# Patient Record
Sex: Female | Born: 1945 | Race: Black or African American | Hispanic: No
Health system: Southern US, Community
[De-identification: ages and names within clinical notes are randomized; demographics above are authoritative.]

---

## 1999-06-09 ENCOUNTER — Ambulatory Visit (HOSPITAL_COMMUNITY): Admission: RE | Admit: 1999-06-09 | Discharge: 1999-06-09 | Payer: Self-pay | Admitting: Internal Medicine

## 1999-06-09 ENCOUNTER — Encounter: Payer: Self-pay | Admitting: Internal Medicine

## 2000-11-09 ENCOUNTER — Other Ambulatory Visit: Admission: RE | Admit: 2000-11-09 | Discharge: 2000-11-09 | Payer: Self-pay | Admitting: *Deleted

## 2009-01-15 ENCOUNTER — Ambulatory Visit (HOSPITAL_COMMUNITY): Admission: RE | Admit: 2009-01-15 | Discharge: 2009-01-15 | Payer: Self-pay | Admitting: Internal Medicine

## 2010-07-25 ENCOUNTER — Encounter: Payer: Self-pay | Admitting: Internal Medicine

## 2012-01-11 ENCOUNTER — Other Ambulatory Visit (HOSPITAL_COMMUNITY): Payer: Self-pay | Admitting: Internal Medicine

## 2012-01-11 DIAGNOSIS — Z1231 Encounter for screening mammogram for malignant neoplasm of breast: Secondary | ICD-10-CM

## 2012-02-01 ENCOUNTER — Ambulatory Visit (HOSPITAL_COMMUNITY)
Admission: RE | Admit: 2012-02-01 | Discharge: 2012-02-01 | Disposition: A | Discharge status: Home / Self Care | Payer: Medicare Other | Source: Ambulatory Visit | Attending: Internal Medicine | Admitting: Internal Medicine

## 2012-02-01 DIAGNOSIS — Z1231 Encounter for screening mammogram for malignant neoplasm of breast: Secondary | ICD-10-CM

## 2013-01-25 ENCOUNTER — Other Ambulatory Visit (HOSPITAL_COMMUNITY): Payer: Self-pay | Admitting: Internal Medicine

## 2013-01-25 DIAGNOSIS — Z1231 Encounter for screening mammogram for malignant neoplasm of breast: Secondary | ICD-10-CM

## 2013-02-15 ENCOUNTER — Ambulatory Visit (HOSPITAL_COMMUNITY)
Admission: RE | Admit: 2013-02-15 | Discharge: 2013-02-15 | Disposition: A | Discharge status: Home / Self Care | Payer: Medicare Other | Source: Ambulatory Visit | Attending: Internal Medicine | Admitting: Internal Medicine

## 2013-02-15 DIAGNOSIS — Z1231 Encounter for screening mammogram for malignant neoplasm of breast: Secondary | ICD-10-CM | POA: Insufficient documentation

## 2014-10-07 ENCOUNTER — Other Ambulatory Visit (HOSPITAL_COMMUNITY): Payer: Self-pay | Admitting: Internal Medicine

## 2014-10-07 DIAGNOSIS — Z1231 Encounter for screening mammogram for malignant neoplasm of breast: Secondary | ICD-10-CM

## 2014-10-09 ENCOUNTER — Ambulatory Visit (HOSPITAL_COMMUNITY)
Admission: RE | Admit: 2014-10-09 | Discharge: 2014-10-09 | Disposition: A | Discharge status: Home / Self Care | Payer: Commercial Managed Care - HMO | Source: Ambulatory Visit | Attending: Internal Medicine | Admitting: Internal Medicine

## 2014-10-09 DIAGNOSIS — Z1231 Encounter for screening mammogram for malignant neoplasm of breast: Secondary | ICD-10-CM | POA: Insufficient documentation

## 2016-12-02 ENCOUNTER — Other Ambulatory Visit: Payer: Self-pay | Admitting: Family Medicine

## 2016-12-02 DIAGNOSIS — M810 Age-related osteoporosis without current pathological fracture: Secondary | ICD-10-CM

## 2017-06-21 ENCOUNTER — Other Ambulatory Visit: Payer: Self-pay | Admitting: Family Medicine

## 2017-06-21 DIAGNOSIS — Z1231 Encounter for screening mammogram for malignant neoplasm of breast: Secondary | ICD-10-CM

## 2017-09-15 ENCOUNTER — Ambulatory Visit
Admission: RE | Admit: 2017-09-15 | Discharge: 2017-09-15 | Disposition: A | Discharge status: Home / Self Care | Payer: Commercial Managed Care - HMO | Source: Ambulatory Visit | Attending: Family Medicine | Admitting: Family Medicine

## 2017-09-15 DIAGNOSIS — Z1231 Encounter for screening mammogram for malignant neoplasm of breast: Secondary | ICD-10-CM

## 2018-06-07 ENCOUNTER — Other Ambulatory Visit: Payer: Self-pay | Admitting: Family Medicine

## 2018-06-07 DIAGNOSIS — E2839 Other primary ovarian failure: Secondary | ICD-10-CM

## 2020-06-02 ENCOUNTER — Other Ambulatory Visit: Payer: Self-pay | Admitting: Family Medicine

## 2020-06-02 DIAGNOSIS — E2839 Other primary ovarian failure: Secondary | ICD-10-CM

## 2020-06-02 DIAGNOSIS — Z1231 Encounter for screening mammogram for malignant neoplasm of breast: Secondary | ICD-10-CM

## 2020-09-10 ENCOUNTER — Other Ambulatory Visit: Payer: Self-pay

## 2020-09-10 ENCOUNTER — Other Ambulatory Visit: Payer: Medicare HMO

## 2020-09-10 ENCOUNTER — Ambulatory Visit
Admission: RE | Admit: 2020-09-10 | Discharge: 2020-09-10 | Disposition: A | Discharge status: Home / Self Care | Payer: Medicare HMO | Source: Ambulatory Visit | Attending: Family Medicine | Admitting: Family Medicine

## 2020-09-10 DIAGNOSIS — Z1231 Encounter for screening mammogram for malignant neoplasm of breast: Secondary | ICD-10-CM

## 2020-09-12 ENCOUNTER — Emergency Department (HOSPITAL_COMMUNITY): Payer: Medicare HMO

## 2020-09-12 ENCOUNTER — Emergency Department (HOSPITAL_COMMUNITY)
Admission: EM | Admit: 2020-09-12 | Discharge: 2020-09-13 | Disposition: A | Discharge status: Home / Self Care | Payer: Medicare HMO | Attending: Emergency Medicine | Admitting: Emergency Medicine

## 2020-09-12 DIAGNOSIS — M25512 Pain in left shoulder: Secondary | ICD-10-CM

## 2020-09-12 DIAGNOSIS — R531 Weakness: Secondary | ICD-10-CM | POA: Diagnosis not present

## 2020-09-12 DIAGNOSIS — M542 Cervicalgia: Secondary | ICD-10-CM | POA: Insufficient documentation

## 2020-09-12 DIAGNOSIS — R42 Dizziness and giddiness: Secondary | ICD-10-CM | POA: Insufficient documentation

## 2020-09-12 DIAGNOSIS — I10 Essential (primary) hypertension: Secondary | ICD-10-CM | POA: Insufficient documentation

## 2020-09-12 DIAGNOSIS — Z79899 Other long term (current) drug therapy: Secondary | ICD-10-CM | POA: Insufficient documentation

## 2020-09-12 LAB — CBC WITH DIFFERENTIAL/PLATELET
Abs Immature Granulocytes: 0.01 10*3/uL (ref 0.00–0.07)
Basophils Absolute: 0 10*3/uL (ref 0.0–0.1)
Basophils Relative: 1 %
Eosinophils Absolute: 0.3 10*3/uL (ref 0.0–0.5)
Eosinophils Relative: 5 %
HCT: 37.5 % (ref 36.0–46.0)
Hemoglobin: 12.7 g/dL (ref 12.0–15.0)
Immature Granulocytes: 0 %
Lymphocytes Relative: 50 %
Lymphs Abs: 3 10*3/uL (ref 0.7–4.0)
MCH: 31 pg (ref 26.0–34.0)
MCHC: 33.9 g/dL (ref 30.0–36.0)
MCV: 91.5 fL (ref 80.0–100.0)
Monocytes Absolute: 0.5 10*3/uL (ref 0.1–1.0)
Monocytes Relative: 8 %
Neutro Abs: 2.2 10*3/uL (ref 1.7–7.7)
Neutrophils Relative %: 36 %
Platelets: 202 10*3/uL (ref 150–400)
RBC: 4.1 MIL/uL (ref 3.87–5.11)
RDW: 12 % (ref 11.5–15.5)
WBC: 6.1 10*3/uL (ref 4.0–10.5)
nRBC: 0 % (ref 0.0–0.2)

## 2020-09-12 LAB — COMPREHENSIVE METABOLIC PANEL
ALT: 17 U/L (ref 0–44)
AST: 44 U/L — ABNORMAL HIGH (ref 15–41)
Albumin: 4.8 g/dL (ref 3.5–5.0)
Alkaline Phosphatase: 80 U/L (ref 38–126)
Anion gap: 9 (ref 5–15)
BUN: 13 mg/dL (ref 8–23)
CO2: 28 mmol/L (ref 22–32)
Calcium: 10.6 mg/dL — ABNORMAL HIGH (ref 8.9–10.3)
Chloride: 100 mmol/L (ref 98–111)
Creatinine, Ser: 0.93 mg/dL (ref 0.44–1.00)
GFR, Estimated: >60 mL/min (ref >60)
Glucose, Bld: 106 mg/dL — ABNORMAL HIGH (ref 70–99)
Potassium: 4.6 mmol/L (ref 3.5–5.1)
Sodium: 137 mmol/L (ref 135–145)
Total Bilirubin: 1.2 mg/dL (ref 0.3–1.2)
Total Protein: 8.9 g/dL — ABNORMAL HIGH (ref 6.5–8.1)

## 2020-09-12 LAB — I-STAT CHEM 8, ED
BUN: 20 mg/dL (ref 8–23)
Calcium, Ion: 1.27 mmol/L (ref 1.15–1.40)
Chloride: 101 mmol/L (ref 98–111)
Creatinine, Ser: 0.9 mg/dL (ref 0.44–1.00)
Glucose, Bld: 110 mg/dL — ABNORMAL HIGH (ref 70–99)
HCT: 43 % (ref 36.0–46.0)
Hemoglobin: 14.6 g/dL (ref 12.0–15.0)
Potassium: 5 mmol/L (ref 3.5–5.1)
Sodium: 140 mmol/L (ref 135–145)
TCO2: 33 mmol/L — ABNORMAL HIGH (ref 22–32)

## 2020-09-12 LAB — TROPONIN I (HIGH SENSITIVITY): Troponin I (High Sensitivity): <2 ng/L (ref <18)

## 2020-09-12 MED ORDER — HYDRALAZINE HCL 20 MG/ML IJ SOLN: 5.0000 mg | Freq: Once | INTRAMUSCULAR | Status: DC

## 2020-09-12 MED ORDER — IOHEXOL 350 MG/ML SOLN
75.0000 mL | Freq: Once | INTRAVENOUS | Status: AC | PRN
  Administered 2020-09-12: 75 mL via INTRAVENOUS

## 2020-09-12 NOTE — ED Triage Notes (Signed)
Pt BIB EMS from home. Pt reports having neck pain on her left side with yawning and radiates to her shoulder - pain comes and goes. Pt also reports feeling dizzy and weak with bilateral leg swelling.  EMS reports that pt takes Cardizem but has never been diagnosed with afib. Pt usually takes cardizem in the morning but took at 4pm today.  Pt BP 220/110 with EMS - NSR  HR 90s  Temp 100.0  CBG 98 Pt is AO

## 2020-09-12 NOTE — ED Provider Notes (Signed)
[facility] Provider Note   CSN: [id number] Arrival date & time: 09/12/20  2047     History Chief Complaint  Patient presents with  . Weakness  . Hypertension    W/ neck pain     [facility] is a 75 y.o. female history of hypertension, here presenting with acute onset of left-sided neck pain.  Patient states that around 6:30 PM, she has acute onset of left-sided neck pain when she moves.  Patient felt dizzy at that time.  She also has some diffuse weakness.  Patient denies any chest pain or shortness of breath.  Denies any back pain.  Patient was noted to be hypertensive with a blood pressure of 220.  Patient has a history of hypertension and is compliant with her meds.  Denies any history of stroke in the past.  Patient is not on blood thinners.   The history is provided by the patient.       No past medical history on file.  There are no problems to display for this patient.   No past surgical history on file.   OB History   No obstetric history on file.     No family history on file.     Home Medications Prior to Admission medications   Not on File    Allergies    Patient has no allergy information on record.  Review of Systems   Review of Systems  Neurological: Positive for weakness.  All other systems reviewed and are negative.   Physical Exam Updated Vital Signs Temp 98.1 F (36.7 C) (Oral)   Ht 5\' 4"  (1.626 m)   Wt 70.8 kg   BMI 26.78 kg/m   Physical Exam Vitals and nursing note reviewed.  HENT:     Head: Normocephalic.     Nose: Nose normal.     Mouth/Throat:     Mouth: Mucous membranes are moist.  Eyes:     Extraocular Movements: Extraocular movements intact.     Pupils: Pupils are equal, round, and reactive to light.  Neck:     Comments: Mild tenderness on the left SCM.  No obvious carotid bruit.  There is no meningeal signs.  Patient is able to range her neck. Cardiovascular:     Rate and  Rhythm: Normal rate and regular rhythm.     Pulses: Normal pulses.  Pulmonary:     Effort: Pulmonary effort is normal.     Breath sounds: Normal breath sounds.  Abdominal:     General: Abdomen is flat.     Palpations: Abdomen is soft.  Musculoskeletal:        General: Normal range of motion.  Skin:    General: Skin is warm.     Capillary Refill: Capillary refill takes less than 2 seconds.  Neurological:     General: No focal deficit present.     Mental Status: She is alert and oriented to person, place, and time.     Comments: Cranial nerve II to XII is intact.  Patient has no facial droop.  Patient has normal finger-to-nose bilaterally.  Normal strength bilaterally  Psychiatric:        Mood and Affect: Mood normal.        Behavior: Behavior normal.     ED Results / Procedures / Treatments   Labs (all labs ordered are listed, but only abnormal results are displayed) Labs Reviewed  I-STAT CHEM 8, ED - Abnormal; Notable for the following  components:      Result Value   Glucose, Bld 110 (*)    TCO2 33 (*)    All other components within normal limits  CBC WITH DIFFERENTIAL/PLATELET  COMPREHENSIVE METABOLIC PANEL  TROPONIN I (HIGH SENSITIVITY)    EKG EKG Interpretation  Date/Time:  Friday September 12 2020 20:55:20 EST Ventricular Rate:  94 PR Interval:    QRS Duration: 95 QT Interval:  339 QTC Calculation: 424 R Axis:   63 Text Interpretation: Sinus rhythm Probable left atrial enlargement No previous ECGs available Confirmed by Richardean Canal ([id number]) on 09/12/2020 9:02:48 PM   Radiology DG Chest Port 1 View  Result Date: 09/12/2020 CLINICAL DATA:  Left-sided neck pain radiating to the shoulder. EXAM: PORTABLE CHEST 1 VIEW COMPARISON:  None. FINDINGS: The heart size and mediastinal contours are within normal limits. There is moderate severity calcification of the aortic arch. Both lungs are clear. Elevation of the right hemidiaphragm is seen. The visualized skeletal  structures are unremarkable. IMPRESSION: 1. No acute cardiopulmonary disease. Electronically Signed   By: Aram Candela M.D.   On: 09/12/2020 21:30    Procedures Procedures   Medications Ordered in ED Medications  hydrALAZINE (APRESOLINE) injection 5 mg (has no administration in time range)    ED Course  I have reviewed the triage vital signs and the nursing notes.  Pertinent labs & imaging results that were available during my care of the patient were reviewed by me and considered in my medical decision making (see chart for details).    MDM Rules/Calculators/A&P                         [facility] is a 75 y.o. female here presenting with left-sided neck pain.  Concern for possible vertebral artery dissection given the neck pain.  Also consider radiculopathy.  Patient has no focal neuro deficit right now.  Plan to get CTA head and neck.   11:12 PM Initial troponin is normal.  CTA pending.  Blood pressure up to 139/70.  Signout pending CTA and second troponin.  Anticipate if both are negative she can be discharged.   Final Clinical Impression(s) / ED Diagnoses Final diagnoses:  None    Rx / DC Orders ED Discharge Orders    None       Charlynne Pander, MD 09/15/20 1757

## 2020-09-12 NOTE — ED Provider Notes (Signed)
Care assumed from Dr. Silverio Lay, patient with left clavicle and neck pain and normal initial troponin and normal ECG pending second troponin and CT angiogram of head and neck.  CT scans did not show any evidence of dissection or blockage.  Repeat troponin is normal.  Patient has been pain-free while she has been in the ED.  She is discharged with instructions to follow-up with PCP.  Results for orders placed or performed during the hospital encounter of 09/12/20  Comprehensive metabolic panel  Result Value Ref Range   Sodium 137 135 - 145 mmol/L   Potassium 4.6 3.5 - 5.1 mmol/L   Chloride 100 98 - 111 mmol/L   CO2 28 22 - 32 mmol/L   Glucose, Bld 106 (H) 70 - 99 mg/dL   BUN 13 8 - 23 mg/dL   Creatinine, Ser 3.88 0.44 - 1.00 mg/dL   Calcium 82.8 (H) 8.9 - 10.3 mg/dL   Total Protein 8.9 (H) 6.5 - 8.1 g/dL   Albumin 4.8 3.5 - 5.0 g/dL   AST 44 (H) 15 - 41 U/L   ALT 17 0 - 44 U/L   Alkaline Phosphatase 80 38 - 126 U/L   Total Bilirubin 1.2 0.3 - 1.2 mg/dL   GFR, Estimated >00 >34 mL/min   Anion gap 9 5 - 15  CBC with Differential/Platelet  Result Value Ref Range   WBC 6.1 4.0 - 10.5 K/uL   RBC 4.10 3.87 - 5.11 MIL/uL   Hemoglobin 12.7 12.0 - 15.0 g/dL   HCT 91.7 91.5 - 05.6 %   MCV 91.5 80.0 - 100.0 fL   MCH 31.0 26.0 - 34.0 pg   MCHC 33.9 30.0 - 36.0 g/dL   RDW 97.9 48.0 - 16.5 %   Platelets 202 150 - 400 K/uL   nRBC 0.0 0.0 - 0.2 %   Neutrophils Relative % 36 %   Neutro Abs 2.2 1.7 - 7.7 K/uL   Lymphocytes Relative 50 %   Lymphs Abs 3.0 0.7 - 4.0 K/uL   Monocytes Relative 8 %   Monocytes Absolute 0.5 0.1 - 1.0 K/uL   Eosinophils Relative 5 %   Eosinophils Absolute 0.3 0.0 - 0.5 K/uL   Basophils Relative 1 %   Basophils Absolute 0.0 0.0 - 0.1 K/uL   Immature Granulocytes 0 %   Abs Immature Granulocytes 0.01 0.00 - 0.07 K/uL  I-stat chem 8, ED (not at [facility] or [facility])  Result Value Ref Range   Sodium 140 135 - 145 mmol/L   Potassium 5.0 3.5 - 5.1 mmol/L   Chloride 101 98 - 111  mmol/L   BUN 20 8 - 23 mg/dL   Creatinine, Ser 5.37 0.44 - 1.00 mg/dL   Glucose, Bld 482 (H) 70 - 99 mg/dL   Calcium, Ion 7.07 8.67 - 1.40 mmol/L   TCO2 33 (H) 22 - 32 mmol/L   Hemoglobin 14.6 12.0 - 15.0 g/dL   HCT 54.4 92.0 - 10.0 %  Troponin I (High Sensitivity)  Result Value Ref Range   Troponin I (High Sensitivity) <2 <18 ng/L  Troponin I (High Sensitivity)  Result Value Ref Range   Troponin I (High Sensitivity) <2 <18 ng/L   CT Angio Head W or Wo Contrast  Result Date: 09/12/2020 CLINICAL DATA:  Dizziness. Neck pain radiating to the left shoulder. EXAM: CT ANGIOGRAPHY HEAD AND NECK TECHNIQUE: Multidetector CT imaging of the head and neck was performed using the standard protocol during bolus administration of intravenous contrast. Multiplanar CT image  reconstructions and MIPs were obtained to evaluate the vascular anatomy. Carotid stenosis measurements (when applicable) are obtained utilizing NASCET criteria, using the distal internal carotid diameter as the denominator. CONTRAST:  40mL OMNIPAQUE IOHEXOL 350 MG/ML SOLN COMPARISON:  None. FINDINGS: CT HEAD FINDINGS Brain: There is no mass, hemorrhage or extra-axial collection. The size and configuration of the ventricles and extra-axial CSF spaces are normal. There is hypoattenuation of the periventricular white matter, most commonly indicating chronic ischemic microangiopathy. Skull: The visualized skull base, calvarium and extracranial soft tissues are normal. Sinuses/Orbits: No fluid levels or advanced mucosal thickening of the visualized paranasal sinuses. No mastoid or middle ear effusion. The orbits are normal. CTA NECK FINDINGS SKELETON: There is no bony spinal canal stenosis. No lytic or blastic lesion. OTHER NECK: Normal pharynx, larynx and major salivary glands. No cervical lymphadenopathy. Unremarkable thyroid gland. UPPER CHEST: No pneumothorax or pleural effusion. No nodules or masses. AORTIC ARCH: There is calcific atherosclerosis  of the aortic arch. There is no aneurysm, dissection or hemodynamically significant stenosis of the visualized portion of the aorta. Conventional 3 vessel aortic branching pattern. The visualized proximal subclavian arteries are widely patent. RIGHT CAROTID SYSTEM: Normal without aneurysm, dissection or stenosis. LEFT CAROTID SYSTEM: Normal without aneurysm, dissection or stenosis. VERTEBRAL ARTERIES: Left dominant configuration. Both origins are clearly patent. There is no dissection, occlusion or flow-limiting stenosis to the skull base (V1-V3 segments). CTA HEAD FINDINGS POSTERIOR CIRCULATION: --Vertebral arteries: Right vertebral artery terminates in PICA. Left is normal. --Inferior cerebellar arteries: Normal. --Basilar artery: Normal. --Superior cerebellar arteries: Normal. --Posterior cerebral arteries (PCA): Normal. ANTERIOR CIRCULATION: --Intracranial internal carotid arteries: Normal. --Anterior cerebral arteries (ACA): Normal. Both A1 segments are present. Patent anterior communicating artery (a-comm). --Middle cerebral arteries (MCA): Normal. VENOUS SINUSES: As permitted by contrast timing, patent. ANATOMIC VARIANTS: None Review of the MIP images confirms the above findings. IMPRESSION: 1. No emergent large vessel occlusion or high-grade stenosis of the head or neck. 2. No vertebral artery dissection. 3. Chronic ischemic microangiopathy. Aortic atherosclerosis (ICD10-I70.0). Electronically Signed   By: Deatra Robinson M.D.   On: 09/12/2020 23:25   CT Angio Neck W and/or Wo Contrast  Result Date: 09/12/2020 CLINICAL DATA:  Dizziness. Neck pain radiating to the left shoulder. EXAM: CT ANGIOGRAPHY HEAD AND NECK TECHNIQUE: Multidetector CT imaging of the head and neck was performed using the standard protocol during bolus administration of intravenous contrast. Multiplanar CT image reconstructions and MIPs were obtained to evaluate the vascular anatomy. Carotid stenosis measurements (when applicable) are  obtained utilizing NASCET criteria, using the distal internal carotid diameter as the denominator. CONTRAST:  21mL OMNIPAQUE IOHEXOL 350 MG/ML SOLN COMPARISON:  None. FINDINGS: CT HEAD FINDINGS Brain: There is no mass, hemorrhage or extra-axial collection. The size and configuration of the ventricles and extra-axial CSF spaces are normal. There is hypoattenuation of the periventricular white matter, most commonly indicating chronic ischemic microangiopathy. Skull: The visualized skull base, calvarium and extracranial soft tissues are normal. Sinuses/Orbits: No fluid levels or advanced mucosal thickening of the visualized paranasal sinuses. No mastoid or middle ear effusion. The orbits are normal. CTA NECK FINDINGS SKELETON: There is no bony spinal canal stenosis. No lytic or blastic lesion. OTHER NECK: Normal pharynx, larynx and major salivary glands. No cervical lymphadenopathy. Unremarkable thyroid gland. UPPER CHEST: No pneumothorax or pleural effusion. No nodules or masses. AORTIC ARCH: There is calcific atherosclerosis of the aortic arch. There is no aneurysm, dissection or hemodynamically significant stenosis of the visualized portion of the aorta. Conventional 3  vessel aortic branching pattern. The visualized proximal subclavian arteries are widely patent. RIGHT CAROTID SYSTEM: Normal without aneurysm, dissection or stenosis. LEFT CAROTID SYSTEM: Normal without aneurysm, dissection or stenosis. VERTEBRAL ARTERIES: Left dominant configuration. Both origins are clearly patent. There is no dissection, occlusion or flow-limiting stenosis to the skull base (V1-V3 segments). CTA HEAD FINDINGS POSTERIOR CIRCULATION: --Vertebral arteries: Right vertebral artery terminates in PICA. Left is normal. --Inferior cerebellar arteries: Normal. --Basilar artery: Normal. --Superior cerebellar arteries: Normal. --Posterior cerebral arteries (PCA): Normal. ANTERIOR CIRCULATION: --Intracranial internal carotid arteries: Normal.  --Anterior cerebral arteries (ACA): Normal. Both A1 segments are present. Patent anterior communicating artery (a-comm). --Middle cerebral arteries (MCA): Normal. VENOUS SINUSES: As permitted by contrast timing, patent. ANATOMIC VARIANTS: None Review of the MIP images confirms the above findings. IMPRESSION: 1. No emergent large vessel occlusion or high-grade stenosis of the head or neck. 2. No vertebral artery dissection. 3. Chronic ischemic microangiopathy. Aortic atherosclerosis (ICD10-I70.0). Electronically Signed   By: Deatra Robinson M.D.   On: 09/12/2020 23:25   DG Chest Port 1 View  Result Date: 09/12/2020 CLINICAL DATA:  Left-sided neck pain radiating to the shoulder. EXAM: PORTABLE CHEST 1 VIEW COMPARISON:  None. FINDINGS: The heart size and mediastinal contours are within normal limits. There is moderate severity calcification of the aortic arch. Both lungs are clear. Elevation of the right hemidiaphragm is seen. The visualized skeletal structures are unremarkable. IMPRESSION: 1. No acute cardiopulmonary disease. Electronically Signed   By: Aram Candela M.D.   On: 09/12/2020 21:30      Dione Booze, MD 09/13/20 [id number]

## 2020-09-13 LAB — TROPONIN I (HIGH SENSITIVITY): Troponin I (High Sensitivity): <2 ng/L (ref <18)

## 2020-09-13 NOTE — Discharge Instructions (Signed)
Your evaluation today did not show the cause for your pain.  Please make a follow-up appointment with your primary care provider.  Return to the emergency department if you are having any problems.

## 2020-09-13 NOTE — ED Notes (Signed)
Patient verbalizes understanding of discharge instructions. Opportunity for questioning and answers were provided. Armband removed by staff, pt discharged from ED via wheelchair.  

## 2021-02-18 ENCOUNTER — Ambulatory Visit
Admission: RE | Admit: 2021-02-18 | Discharge: 2021-02-18 | Disposition: A | Discharge status: Home / Self Care | Payer: Medicare HMO | Source: Ambulatory Visit | Attending: Family Medicine | Admitting: Family Medicine

## 2021-02-18 ENCOUNTER — Other Ambulatory Visit: Payer: Self-pay

## 2021-02-18 DIAGNOSIS — E2839 Other primary ovarian failure: Secondary | ICD-10-CM

## 2022-01-07 IMAGING — CT CT ANGIO NECK
2 of 11 series · 6 of 34 positions shown · IV contrast (omnipaque)
Comparison: None.

CLINICAL DATA: Dizziness. Neck pain radiating to the left shoulder.

EXAM:
CT ANGIOGRAPHY HEAD AND NECK
TECHNIQUE: Multidetector CT imaging of the head and neck was performed using
the standard protocol during bolus administration of intravenous
contrast. Multiplanar CT image reconstructions and MIPs were
obtained to evaluate the vascular anatomy. Carotid stenosis
measurements (when applicable) are obtained utilizing NASCET
criteria, using the distal internal carotid diameter as the
denominator.
CONTRAST:  75mL OMNIPAQUE IOHEXOL 350 MG/ML SOLN

[Series 10: sagittal · sagittal · 0.31mm/px · 1 of 55 slices shown]
[im 47/55  soft-tissue]
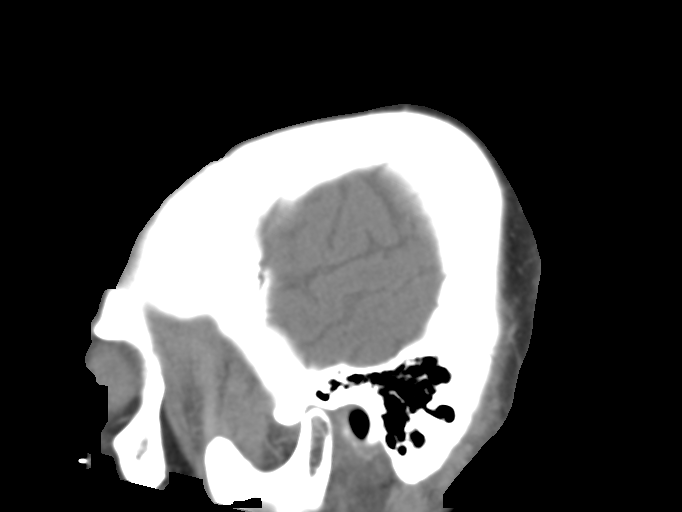

[Series 11: cta neck axial · axial · 0.39mm/px · z∈[-242,-2]mm · 5 of 367 slices shown]
[im 62/367  soft-tissue]
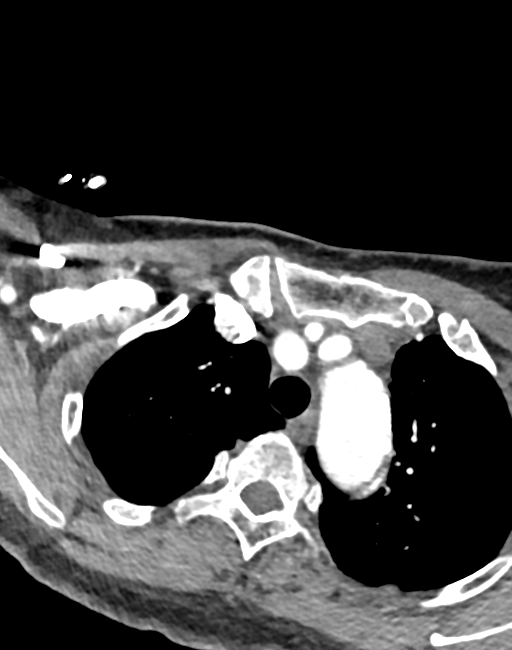
[im 123/367  bone]
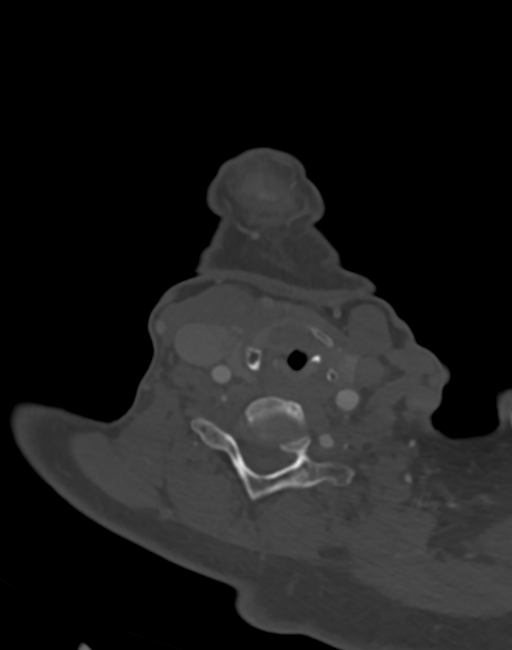
[im 184/367  soft-tissue]
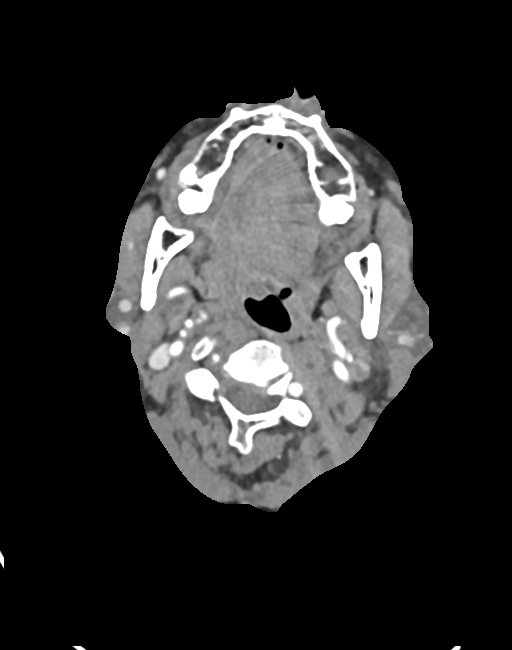
[im 245/367  bone]
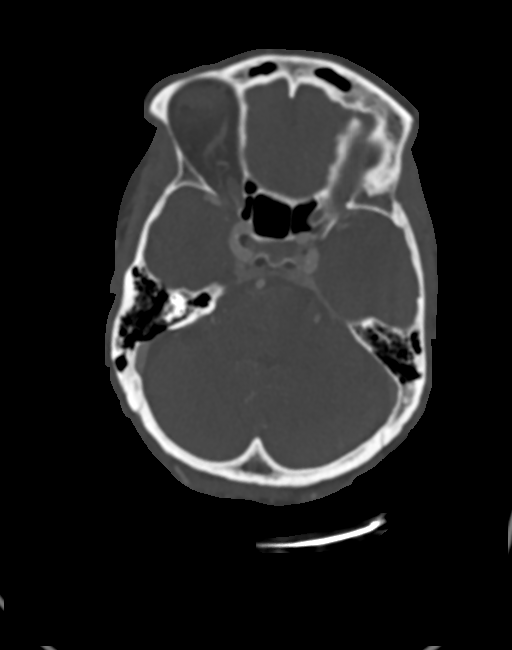
[im 306/367  soft-tissue]
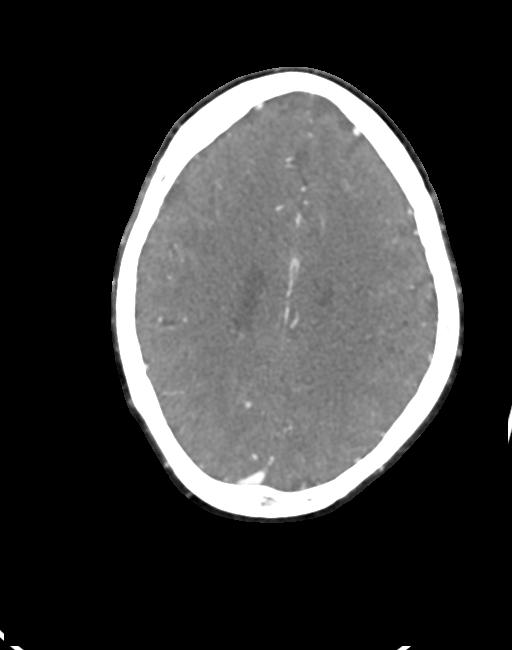

[6 of 34 positions shown; findings below may reference images not displayed]

FINDINGS: CT HEAD FINDINGS

Brain: There is no mass, hemorrhage or extra-axial collection. The
size and configuration of the ventricles and extra-axial CSF spaces
are normal. There is hypoattenuation of the periventricular white
matter, most commonly indicating chronic ischemic microangiopathy.

Skull: The visualized skull base, calvarium and extracranial soft
tissues are normal.

Sinuses/Orbits: No fluid levels or advanced mucosal thickening of
the visualized paranasal sinuses. No mastoid or middle ear effusion.
The orbits are normal.

CTA NECK FINDINGS

SKELETON: There is no bony spinal canal stenosis. No lytic or
blastic lesion.

OTHER NECK: Normal pharynx, larynx and major salivary glands. No
cervical lymphadenopathy. Unremarkable thyroid gland.

UPPER CHEST: No pneumothorax or pleural effusion. No nodules or
masses.

AORTIC ARCH:

There is calcific atherosclerosis of the aortic arch. There is no
aneurysm, dissection or hemodynamically significant stenosis of the
visualized portion of the aorta. Conventional 3 vessel aortic
branching pattern. The visualized proximal subclavian arteries are
widely patent.

RIGHT CAROTID SYSTEM: Normal without aneurysm, dissection or
stenosis.

LEFT CAROTID SYSTEM: Normal without aneurysm, dissection or
stenosis.

VERTEBRAL ARTERIES: Left dominant configuration. Both origins are
clearly patent. There is no dissection, occlusion or flow-limiting
stenosis to the skull base (V1-V3 segments).

CTA HEAD FINDINGS

POSTERIOR CIRCULATION:

--Vertebral arteries: Right vertebral artery terminates in PICA.
Left is normal.

--Inferior cerebellar arteries: Normal.

--Basilar artery: Normal.

--Superior cerebellar arteries: Normal.

--Posterior cerebral arteries (PCA): Normal.

ANTERIOR CIRCULATION:

--Intracranial internal carotid arteries: Normal.

--Anterior cerebral arteries (ACA): Normal. Both A1 segments are
present. Patent anterior communicating artery (a-comm).

--Middle cerebral arteries (MCA): Normal.

VENOUS SINUSES: As permitted by contrast timing, patent.

ANATOMIC VARIANTS: None

Review of the MIP images confirms the above findings.
IMPRESSION: 1. No emergent large vessel occlusion or high-grade stenosis of the
head or neck.
2. No vertebral artery dissection.
3. Chronic ischemic microangiopathy.

Aortic atherosclerosis ([id number]).
